# Patient Record
Sex: Male | Born: 1985 | Race: White | Hispanic: No | Marital: Married | Smoking: Never smoker
Health system: Southern US, Community
[De-identification: ages and names within clinical notes are randomized; demographics above are authoritative.]

## PROBLEM LIST (undated history)

## (undated) HISTORY — PX: MANDIBLE FRACTURE SURGERY: SHX706

---
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
## 2007-01-17 ENCOUNTER — Emergency Department: Payer: Self-pay | Admitting: Unknown Physician Specialty

## 2007-06-12 ENCOUNTER — Observation Stay (HOSPITAL_COMMUNITY): Admission: EM | Admit: 2007-06-12 | Discharge: 2007-06-13 | Payer: Self-pay | Admitting: Emergency Medicine

## 2007-06-19 ENCOUNTER — Ambulatory Visit (HOSPITAL_COMMUNITY): Admission: RE | Admit: 2007-06-19 | Discharge: 2007-06-19 | Payer: Self-pay | Admitting: Otolaryngology

## 2007-07-20 ENCOUNTER — Ambulatory Visit (HOSPITAL_BASED_OUTPATIENT_CLINIC_OR_DEPARTMENT_OTHER): Admission: RE | Admit: 2007-07-20 | Discharge: 2007-07-20 | Payer: Self-pay | Admitting: Otolaryngology
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
## 2011-07-22 LAB — POCT HEMOGLOBIN-HEMACUE: Hemoglobin: 16

## 2011-07-23 LAB — I-STAT 8, (EC8 V) (CONVERTED LAB)
Acid-base deficit: 3 — ABNORMAL HIGH
BUN: 16
Bicarbonate: 22
Chloride: 105
HCT: 47
Hemoglobin: 16
Operator id: 277751
Sodium: 140

## 2011-07-23 LAB — SALICYLATE LEVEL: Salicylate Lvl: <4

## 2011-07-23 LAB — CBC
Hemoglobin: 14.9
RBC: 5.13
RDW: 13.7

## 2011-07-23 LAB — ACETAMINOPHEN LEVEL: Acetaminophen (Tylenol), Serum: <10 — ABNORMAL LOW

## 2011-07-23 LAB — ETHANOL: Alcohol, Ethyl (B): 176 — ABNORMAL HIGH

## 2018-08-30 ENCOUNTER — Encounter: Payer: Self-pay | Admitting: Urology

## 2018-08-30 ENCOUNTER — Ambulatory Visit: Payer: Self-pay | Admitting: Urology

## 2022-03-03 ENCOUNTER — Ambulatory Visit (INDEPENDENT_AMBULATORY_CARE_PROVIDER_SITE_OTHER): Payer: Managed Care, Other (non HMO) | Admitting: Dermatology

## 2022-03-03 DIAGNOSIS — D1800 Hemangioma unspecified site: Secondary | ICD-10-CM | POA: Diagnosis not present

## 2022-03-03 DIAGNOSIS — L578 Other skin changes due to chronic exposure to nonionizing radiation: Secondary | ICD-10-CM | POA: Diagnosis not present

## 2022-03-03 DIAGNOSIS — D485 Neoplasm of uncertain behavior of skin: Secondary | ICD-10-CM | POA: Diagnosis not present

## 2022-03-03 DIAGNOSIS — D492 Neoplasm of unspecified behavior of bone, soft tissue, and skin: Secondary | ICD-10-CM

## 2022-03-03 NOTE — Patient Instructions (Addendum)

## 2022-03-03 NOTE — Progress Notes (Signed)
New Patient Visit  Subjective  Eddie Perkins is a 36 y.o. male who presents for the following: check spot (L post neck, 1 yr, irritating).  It is also blood in the past.  He has also a spot on his shoulder that is irritating.  These are concerning and irritating enough he would like removed. He has vascular birthmark on the left neck and left shoulder.  These lesions came up within the area of the birthmark. The patient has spots, moles and lesions to be evaluated, some may be new or changing and the patient has concerns that these could be cancer.  The following portions of the chart were reviewed this encounter and updated as appropriate:   Allergies  Meds  Problems  Med Hx  Surg Hx  Fam Hx     Review of Systems:  No other skin or systemic complaints except as noted in HPI or Assessment and Plan.  Objective  Well appearing patient in no apparent distress; mood and affect are within normal limits.  A focused examination was performed including face, neck. Relevant physical exam findings are noted in the Assessment and Plan.  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     L lat neck/shoulder Large red plaque      Assessment & Plan  Neoplasm of skin (2) L lat neck inra auricular Epidermal / dermal shaving  Lesion diameter (cm):  0.8 Informed consent: discussed and consent obtained   Timeout: patient name, date of birth, surgical site, and procedure verified   Procedure prep:  Patient was prepped and draped in usual sterile fashion Prep type:  Isopropyl alcohol Anesthesia: the lesion was anesthetized in a standard fashion   Anesthetic:  1% lidocaine w/ epinephrine 1-100,000 buffered w/ 8.4% NaHCO3 Instrument used: flexible razor blade   Hemostasis achieved with: pressure, aluminum chloride and electrodesiccation   Outcome: patient tolerated procedure well    Post-procedure details: sterile dressing applied and wound care instructions given   Dressing type: bandage, bacitracin and pressure dressing    Specimen 1 - Surgical pathology Differential Diagnosis: D48.5 Hemangioma vs Pyogenic Granuloma  Check Margins: No 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  L top of shoulder Epidermal / dermal shaving  Lesion diameter (cm):  0.5 Informed consent: discussed and consent obtained   Timeout: patient name, date of birth, surgical site, and procedure verified   Procedure prep:  Patient was prepped and draped in usual sterile fashion Prep type:  Isopropyl alcohol Anesthesia: the lesion was anesthetized in a standard fashion   Anesthetic:  1% lidocaine w/ epinephrine 1-100,000 buffered w/ 8.4% NaHCO3 Instrument used: flexible razor blade   Hemostasis achieved with: pressure, aluminum chloride and electrodesiccation   Outcome: patient tolerated procedure well   Post-procedure details: sterile dressing applied and wound care instructions given   Dressing type: bandage, pressure dressing and bacitracin    Specimen 2 - Surgical pathology Differential Diagnosis: D48.5 Hemangioma vs Pyogenic Granuloma Check Margins: No 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  Observation.  Call clinic for new or changing lesions.  Recommend daily use of broad spectrum spf 30+ sunscreen to sun-exposed areas.   Discussed the treatment option of BBL/laser.  Typically we recommend 1-3 treatment sessions about 5-8 weeks apart for best results.  The patient's condition may require "maintenance treatments" in the future.  The fee for  BBL / laser treatments is $350 per treatment session for the whole face.  A fee can be quoted for other parts of the body. Insurance typically does not pay for BBL/laser treatments and therefore the fee is an out-of-pocket cost.  Actinic Damage - chronic, secondary to cumulative  UV radiation exposure/sun exposure over time - diffuse scaly erythematous macules with underlying dyspigmentation - Recommend daily broad spectrum sunscreen SPF 30+ to sun-exposed areas, reapply every 2 hours as needed.  - Recommend staying in the shade or wearing long sleeves, sun glasses (UVA+UVB protection) and wide brim hats (4-inch brim around the entire circumference of the hat). - Call for new or changing lesions.  Return if symptoms worsen or fail to improve.  I, Othelia Pulling, RMA, am acting as scribe for Sarina Ser, MD . Documentation: I have reviewed the above documentation for accuracy and completeness, and I agree with the above.  Sarina Ser, MD

## 2022-03-08 ENCOUNTER — Encounter: Payer: Self-pay | Admitting: Dermatology

## 2022-03-09 ENCOUNTER — Telehealth: Payer: Self-pay

## 2022-03-09 NOTE — Telephone Encounter (Signed)
-----   Message from Ralene Bathe, MD sent at 03/04/2022  4:40 PM EDT ----- Diagnosis 1. Skin , left lat neck inra auricular HEMANGIOMA 2. Skin , left top of shoulder HEMANGIOMA  1&2 - both benign hemangiomas = collection of blood vessels May get new ones associated with birthmark No further treatment needed

## 2022-03-09 NOTE — Telephone Encounter (Signed)
Advised pt of bx results/sh ?

## 2022-06-28 ENCOUNTER — Ambulatory Visit
Admission: EM | Admit: 2022-06-28 | Discharge: 2022-06-28 | Disposition: A | Discharge status: Home / Self Care | Payer: Managed Care, Other (non HMO) | Attending: Emergency Medicine | Admitting: Emergency Medicine

## 2022-06-28 DIAGNOSIS — U071 COVID-19: Secondary | ICD-10-CM | POA: Diagnosis not present

## 2022-06-28 DIAGNOSIS — B349 Viral infection, unspecified: Secondary | ICD-10-CM

## 2022-06-28 NOTE — Discharge Instructions (Addendum)
Your COVID test is pending.    Take Tylenol or ibuprofen as needed for fever or discomfort.  Rest and keep yourself hydrated.    Follow-up with your primary care provider if your symptoms are not improving.     

## 2022-06-28 NOTE — ED Triage Notes (Signed)
Patient to Urgent Care with complaints of nasal congestion that started yestreday. Reports at 9am today he started feeling extremely fatigued, has been having chills, unknown if fever.

## 2022-06-28 NOTE — ED Provider Notes (Signed)
Eddie Perkins    CSN: [id number] Arrival date & time: 06/28/22  1535      History   Chief Complaint Chief Complaint  Patient presents with   Nasal Congestion   Fatigue    HPI Eddie Perkins is a 36 y.o. male.  Patient presents with fatigue, chills, body aches, congestion, scratchy throat since this morning.  No fever, rash, ear pain, cough, shortness of breath, vomiting, diarrhea, or other symptoms.  No OTC treatments attempted.  No pertinent medical history.  The history is provided by the patient.    History reviewed. No pertinent past medical history.  There are no problems to display for this patient.   Past Surgical History:  Procedure Laterality Date   MANDIBLE FRACTURE SURGERY         Home Medications    Prior to Admission medications   Not on File    Family History History reviewed. No pertinent family history.  Social History Social History   Tobacco Use   Smoking status: Never   Smokeless tobacco: Never  Substance Use Topics   Alcohol use: Yes   Drug use: Never     Allergies   Penicillins   Review of Systems Review of Systems  Constitutional:  Positive for chills and fatigue. Negative for fever.  HENT:  Positive for congestion and sore throat. Negative for ear pain.   Eyes:  Negative for pain and visual disturbance.  Respiratory:  Negative for cough and shortness of breath.   Cardiovascular:  Negative for chest pain and palpitations.  Gastrointestinal:  Negative for abdominal pain and vomiting.  Genitourinary:  Negative for dysuria and hematuria.  Musculoskeletal:  Negative for arthralgias and back pain.  Skin:  Negative for color change and rash.  Neurological:  Negative for seizures and syncope.  All other systems reviewed and are negative.    Physical Exam Triage Vital Signs ED Triage Vitals  Enc Vitals Group     BP 06/28/22 1553 111/71     Pulse Rate 06/28/22 1553 (!) 106     Resp 06/28/22 1553 18     Temp  06/28/22 1553 98.8 F (37.1 C)     Temp src --      SpO2 06/28/22 1553 97 %     Weight --      Height --      Head Circumference --      Peak Flow --      Pain Score 06/28/22 1555 3     Pain Loc --      Pain Edu? --      Excl. in [place]? --    No data found.  Updated Vital Signs BP 111/71   Pulse (!) 106   Temp 98.8 F (37.1 C)   Resp 18   SpO2 97%   Visual Acuity Right Eye Distance:   Left Eye Distance:   Bilateral Distance:    Right Eye Near:   Left Eye Near:    Bilateral Near:     Physical Exam Vitals and nursing note reviewed.  Constitutional:      General: He is not in acute distress.    Appearance: Normal appearance. He is well-developed. He is not ill-appearing.  HENT:     Right Ear: Tympanic membrane normal.     Left Ear: Tympanic membrane normal.     Nose: Nose normal.     Mouth/Throat:     Mouth: Mucous membranes are moist.     Pharynx: Oropharynx  is clear.  Cardiovascular:     Rate and Rhythm: Normal rate and regular rhythm.     Heart sounds: Normal heart sounds.  Pulmonary:     Effort: Pulmonary effort is normal. No respiratory distress.     Breath sounds: Normal breath sounds.  Musculoskeletal:     Cervical back: Neck supple.  Skin:    General: Skin is warm and dry.  Neurological:     Mental Status: He is alert.  Psychiatric:        Mood and Affect: Mood normal.        Behavior: Behavior normal.      UC Treatments / Results  Labs (all labs ordered are listed, but only abnormal results are displayed) Labs Reviewed  SARS CORONAVIRUS 2 (TAT 6-24 HRS)    EKG   Radiology No results found.  Procedures Procedures (including critical care time)  Medications Ordered in UC Medications - No data to display  Initial Impression / Assessment and Plan / UC Course  I have reviewed the triage vital signs and the nursing notes.  Pertinent labs & imaging results that were available during my care of the patient were reviewed by me and  considered in my medical decision making (see chart for details).    Viral illness.  COVID pending.  Discussed symptomatic treatment including Tylenol or ibuprofen, rest, hydration.  Instructed patient to follow up with his PCP if his symptoms are not improving.  He agrees to plan of care.   Final Clinical Impressions(s) / UC Diagnoses   Final diagnoses:  Viral illness     Discharge Instructions      Your COVID test is pending.    Take Tylenol or ibuprofen as needed for fever or discomfort.  Rest and keep yourself hydrated.    Follow-up with your primary care provider if your symptoms are not improving.         ED Prescriptions   None    PDMP not reviewed this encounter.   Sharion Balloon, NP 06/28/22 [id number]

## 2022-06-29 LAB — SARS CORONAVIRUS 2 (TAT 6-24 HRS): SARS Coronavirus 2: POSITIVE — AB

## 2024-06-06 ENCOUNTER — Ambulatory Visit
Admission: EM | Admit: 2024-06-06 | Discharge: 2024-06-06 | Disposition: A | Discharge status: Home / Self Care | Attending: Emergency Medicine | Admitting: Emergency Medicine

## 2024-06-06 DIAGNOSIS — J069 Acute upper respiratory infection, unspecified: Secondary | ICD-10-CM

## 2024-06-06 MED ORDER — AZITHROMYCIN 250 MG PO TABS: 250.0000 mg | ORAL_TABLET | Freq: Every day | ORAL | 0 refills | Status: AC

## 2024-06-06 NOTE — ED Triage Notes (Signed)
 Patient presents to UC for cough, congestion, and sore throat x 6 days. Pt reports worse at night. Treating symptoms with dayquil and nyquil.   Denies fever.

## 2024-06-06 NOTE — Discharge Instructions (Addendum)
 Take the Zithromax as directed.  Follow up with your primary care provider if your symptoms are not improving.

## 2024-06-06 NOTE — ED Provider Notes (Signed)
 CAY RALPH PELT    CSN: [id number] Arrival date & time: 06/06/24  9051      History   Chief Complaint Chief Complaint  Patient presents with   Sore Throat   Cough   Nasal Congestion    HPI Eddie Perkins is a 38 y.o. male.  Patient presents with 1 week history of sore throat, congestion, cough.  No fever, shortness of breath, vomiting, diarrhea.  He has been treating his symptoms with DayQuil and NyQuil without relief.  The history is provided by the patient and medical records.    History reviewed. No pertinent past medical history.  There are no active problems to display for this patient.   Past Surgical History:  Procedure Laterality Date   MANDIBLE FRACTURE SURGERY         Home Medications    Prior to Admission medications   Medication Sig Start Date End Date Taking? Authorizing Provider  azithromycin  (ZITHROMAX ) 250 MG tablet Take 1 tablet (250 mg total) by mouth daily. Take first 2 tablets together, then 1 every day until finished. 06/06/24  Yes Corlis Burnard DEL, NP    Family History History reviewed. No pertinent family history.  Social History Social History   Tobacco Use   Smoking status: Never   Smokeless tobacco: Never  Vaping Use   Vaping status: Never Used  Substance Use Topics   Alcohol use: Yes   Drug use: Never     Allergies   Penicillins   Review of Systems Review of Systems  Constitutional:  Negative for chills and fever.  HENT:  Positive for congestion and sore throat. Negative for ear pain.   Respiratory:  Positive for cough. Negative for shortness of breath.   Gastrointestinal:  Negative for diarrhea and vomiting.     Physical Exam Triage Vital Signs ED Triage Vitals  Encounter Vitals Group     BP 06/06/24 1020 114/75     Girls Systolic BP Percentile --      Girls Diastolic BP Percentile --      Boys Systolic BP Percentile --      Boys Diastolic BP Percentile --      Pulse Rate 06/06/24 1020 79     Resp  06/06/24 1020 16     Temp 06/06/24 1020 (!) 97.5 F (36.4 C)     Temp Source 06/06/24 1020 Temporal     SpO2 06/06/24 1020 97 %     Weight --      Height --      Head Circumference --      Peak Flow --      Pain Score 06/06/24 1019 2     Pain Loc --      Pain Education --      Exclude from Growth Chart --    No data found.  Updated Vital Signs BP 114/75 (BP Location: Left Arm)   Pulse 79   Temp (!) 97.5 F (36.4 C) (Temporal)   Resp 16   SpO2 97%   Visual Acuity Right Eye Distance:   Left Eye Distance:   Bilateral Distance:    Right Eye Near:   Left Eye Near:    Bilateral Near:     Physical Exam Constitutional:      General: He is not in acute distress. HENT:     Head:     Comments: Both TMs mildly erythematous.    Nose: Congestion present.     Mouth/Throat:     Mouth:  Mucous membranes are moist.     Pharynx: Posterior oropharyngeal erythema present.  Cardiovascular:     Rate and Rhythm: Normal rate and regular rhythm.     Heart sounds: Normal heart sounds.  Pulmonary:     Effort: Pulmonary effort is normal. No respiratory distress.     Breath sounds: Normal breath sounds.  Neurological:     Mental Status: He is alert.      UC Treatments / Results  Labs (all labs ordered are listed, but only abnormal results are displayed) Labs Reviewed - No data to display  EKG   Radiology No results found.  Procedures Procedures (including critical care time)  Medications Ordered in UC Medications - No data to display  Initial Impression / Assessment and Plan / UC Course  I have reviewed the triage vital signs and the nursing notes.  Pertinent labs & imaging results that were available during my care of the patient were reviewed by me and considered in my medical decision making (see chart for details).    Acute upper respiratory infection.  Afebrile and vital signs are stable.  Lungs are clear and O2 sat is 97% on room air.  Patient is allergic to  penicillins.  Treating today with Zithromax .  Education provided on upper respiratory infection.  Symptomatic management discussed.  Instructed him to follow-up with his PCP if he is not improving.  He agrees to plan of care.  Final Clinical Impressions(s) / UC Diagnoses   Final diagnoses:  Acute upper respiratory infection     Discharge Instructions      Take the Zithromax  as directed.  Follow-up with your primary care provider if your symptoms are not improving.      ED Prescriptions     Medication Sig Dispense Auth. Provider   azithromycin  (ZITHROMAX ) 250 MG tablet Take 1 tablet (250 mg total) by mouth daily. Take first 2 tablets together, then 1 every day until finished. 6 tablet Corlis Burnard DEL, NP      PDMP not reviewed this encounter.   Corlis Burnard DEL, NP 06/06/24 1051
